# Patient Record
Sex: Female | Born: 1935
Health system: Southern US, Community
[De-identification: ages and names within clinical notes are randomized; demographics above are authoritative.]

## PROBLEM LIST (undated history)

## (undated) DIAGNOSIS — I10 Essential (primary) hypertension: Secondary | ICD-10-CM

---

## 2010-11-10 ENCOUNTER — Ambulatory Visit (HOSPITAL_COMMUNITY): Admission: AD | Admit: 2010-11-10 | Discharge: 2010-11-10 | Payer: Self-pay | Admitting: Internal Medicine

## 2012-10-10 ENCOUNTER — Ambulatory Visit (HOSPITAL_COMMUNITY)
Admission: RE | Admit: 2012-10-10 | Discharge: 2012-10-10 | Disposition: A | Discharge status: Home / Self Care | Payer: Medicare Other | Source: Ambulatory Visit | Attending: Internal Medicine | Admitting: Internal Medicine

## 2012-10-10 ENCOUNTER — Other Ambulatory Visit (HOSPITAL_COMMUNITY): Payer: Self-pay | Admitting: Internal Medicine

## 2012-10-10 DIAGNOSIS — G459 Transient cerebral ischemic attack, unspecified: Secondary | ICD-10-CM

## 2012-10-10 DIAGNOSIS — R5381 Other malaise: Secondary | ICD-10-CM | POA: Insufficient documentation

## 2012-10-10 DIAGNOSIS — R209 Unspecified disturbances of skin sensation: Secondary | ICD-10-CM | POA: Insufficient documentation

## 2014-05-20 ENCOUNTER — Encounter (INDEPENDENT_AMBULATORY_CARE_PROVIDER_SITE_OTHER): Payer: Self-pay | Admitting: *Deleted

## 2014-05-20 ENCOUNTER — Encounter (INDEPENDENT_AMBULATORY_CARE_PROVIDER_SITE_OTHER): Payer: Self-pay

## 2016-02-24 DIAGNOSIS — I10 Essential (primary) hypertension: Secondary | ICD-10-CM | POA: Diagnosis not present

## 2016-02-24 DIAGNOSIS — E782 Mixed hyperlipidemia: Secondary | ICD-10-CM | POA: Diagnosis not present

## 2016-02-27 DIAGNOSIS — E559 Vitamin D deficiency, unspecified: Secondary | ICD-10-CM | POA: Diagnosis not present

## 2016-02-27 DIAGNOSIS — R6889 Other general symptoms and signs: Secondary | ICD-10-CM | POA: Diagnosis not present

## 2016-02-27 DIAGNOSIS — R350 Frequency of micturition: Secondary | ICD-10-CM | POA: Diagnosis not present

## 2016-02-27 DIAGNOSIS — I10 Essential (primary) hypertension: Secondary | ICD-10-CM | POA: Diagnosis not present

## 2016-06-27 DIAGNOSIS — I10 Essential (primary) hypertension: Secondary | ICD-10-CM | POA: Diagnosis not present

## 2016-06-27 DIAGNOSIS — R600 Localized edema: Secondary | ICD-10-CM | POA: Diagnosis not present

## 2016-07-24 DIAGNOSIS — I10 Essential (primary) hypertension: Secondary | ICD-10-CM | POA: Diagnosis not present

## 2016-07-24 DIAGNOSIS — R600 Localized edema: Secondary | ICD-10-CM | POA: Diagnosis not present

## 2016-09-10 DIAGNOSIS — E782 Mixed hyperlipidemia: Secondary | ICD-10-CM | POA: Diagnosis not present

## 2016-09-10 DIAGNOSIS — I10 Essential (primary) hypertension: Secondary | ICD-10-CM | POA: Diagnosis not present

## 2016-09-10 DIAGNOSIS — E559 Vitamin D deficiency, unspecified: Secondary | ICD-10-CM | POA: Diagnosis not present

## 2016-09-12 DIAGNOSIS — Z0000 Encounter for general adult medical examination without abnormal findings: Secondary | ICD-10-CM | POA: Diagnosis not present

## 2016-09-12 DIAGNOSIS — Z23 Encounter for immunization: Secondary | ICD-10-CM | POA: Diagnosis not present

## 2016-09-12 DIAGNOSIS — K219 Gastro-esophageal reflux disease without esophagitis: Secondary | ICD-10-CM | POA: Diagnosis not present

## 2016-09-12 DIAGNOSIS — E559 Vitamin D deficiency, unspecified: Secondary | ICD-10-CM | POA: Diagnosis not present

## 2016-09-12 DIAGNOSIS — I10 Essential (primary) hypertension: Secondary | ICD-10-CM | POA: Diagnosis not present

## 2017-03-25 DIAGNOSIS — I10 Essential (primary) hypertension: Secondary | ICD-10-CM | POA: Diagnosis not present

## 2017-03-25 DIAGNOSIS — E782 Mixed hyperlipidemia: Secondary | ICD-10-CM | POA: Diagnosis not present

## 2017-03-25 DIAGNOSIS — E559 Vitamin D deficiency, unspecified: Secondary | ICD-10-CM | POA: Diagnosis not present

## 2017-03-27 DIAGNOSIS — K219 Gastro-esophageal reflux disease without esophagitis: Secondary | ICD-10-CM | POA: Diagnosis not present

## 2017-03-27 DIAGNOSIS — E559 Vitamin D deficiency, unspecified: Secondary | ICD-10-CM | POA: Diagnosis not present

## 2017-03-27 DIAGNOSIS — I10 Essential (primary) hypertension: Secondary | ICD-10-CM | POA: Diagnosis not present

## 2017-03-27 DIAGNOSIS — Z6820 Body mass index (BMI) 20.0-20.9, adult: Secondary | ICD-10-CM | POA: Diagnosis not present

## 2017-04-22 DIAGNOSIS — Z6820 Body mass index (BMI) 20.0-20.9, adult: Secondary | ICD-10-CM | POA: Diagnosis not present

## 2017-04-22 DIAGNOSIS — I10 Essential (primary) hypertension: Secondary | ICD-10-CM | POA: Diagnosis not present

## 2017-04-22 DIAGNOSIS — R69 Illness, unspecified: Secondary | ICD-10-CM | POA: Diagnosis not present

## 2017-05-14 DIAGNOSIS — I10 Essential (primary) hypertension: Secondary | ICD-10-CM | POA: Diagnosis not present

## 2017-05-14 DIAGNOSIS — R69 Illness, unspecified: Secondary | ICD-10-CM | POA: Diagnosis not present

## 2017-05-14 DIAGNOSIS — Z6820 Body mass index (BMI) 20.0-20.9, adult: Secondary | ICD-10-CM | POA: Diagnosis not present

## 2017-09-30 DIAGNOSIS — I10 Essential (primary) hypertension: Secondary | ICD-10-CM | POA: Diagnosis not present

## 2017-09-30 DIAGNOSIS — E559 Vitamin D deficiency, unspecified: Secondary | ICD-10-CM | POA: Diagnosis not present

## 2017-09-30 DIAGNOSIS — E782 Mixed hyperlipidemia: Secondary | ICD-10-CM | POA: Diagnosis not present

## 2017-10-02 DIAGNOSIS — E559 Vitamin D deficiency, unspecified: Secondary | ICD-10-CM | POA: Diagnosis not present

## 2017-10-02 DIAGNOSIS — K219 Gastro-esophageal reflux disease without esophagitis: Secondary | ICD-10-CM | POA: Diagnosis not present

## 2017-10-02 DIAGNOSIS — I10 Essential (primary) hypertension: Secondary | ICD-10-CM | POA: Diagnosis not present

## 2017-10-02 DIAGNOSIS — M79604 Pain in right leg: Secondary | ICD-10-CM | POA: Diagnosis not present

## 2017-10-02 DIAGNOSIS — Z23 Encounter for immunization: Secondary | ICD-10-CM | POA: Diagnosis not present

## 2017-11-18 ENCOUNTER — Other Ambulatory Visit (HOSPITAL_COMMUNITY): Payer: Self-pay | Admitting: Internal Medicine

## 2017-11-18 ENCOUNTER — Ambulatory Visit (HOSPITAL_COMMUNITY)
Admission: RE | Admit: 2017-11-18 | Discharge: 2017-11-18 | Disposition: A | Discharge status: Home / Self Care | Payer: Medicare HMO | Source: Ambulatory Visit | Attending: Internal Medicine | Admitting: Internal Medicine

## 2017-11-18 DIAGNOSIS — R1011 Right upper quadrant pain: Secondary | ICD-10-CM | POA: Diagnosis not present

## 2017-11-18 DIAGNOSIS — R109 Unspecified abdominal pain: Secondary | ICD-10-CM | POA: Insufficient documentation

## 2017-11-18 DIAGNOSIS — Z6820 Body mass index (BMI) 20.0-20.9, adult: Secondary | ICD-10-CM | POA: Diagnosis not present

## 2017-11-18 DIAGNOSIS — I10 Essential (primary) hypertension: Secondary | ICD-10-CM | POA: Diagnosis not present

## 2018-02-03 DIAGNOSIS — I10 Essential (primary) hypertension: Secondary | ICD-10-CM | POA: Diagnosis not present

## 2018-02-03 DIAGNOSIS — E782 Mixed hyperlipidemia: Secondary | ICD-10-CM | POA: Diagnosis not present

## 2018-02-03 DIAGNOSIS — K219 Gastro-esophageal reflux disease without esophagitis: Secondary | ICD-10-CM | POA: Diagnosis not present

## 2018-02-03 DIAGNOSIS — R600 Localized edema: Secondary | ICD-10-CM | POA: Diagnosis not present

## 2018-02-03 DIAGNOSIS — Z6820 Body mass index (BMI) 20.0-20.9, adult: Secondary | ICD-10-CM | POA: Diagnosis not present

## 2018-02-03 DIAGNOSIS — R69 Illness, unspecified: Secondary | ICD-10-CM | POA: Diagnosis not present

## 2018-02-03 DIAGNOSIS — R109 Unspecified abdominal pain: Secondary | ICD-10-CM | POA: Diagnosis not present

## 2018-02-03 DIAGNOSIS — E559 Vitamin D deficiency, unspecified: Secondary | ICD-10-CM | POA: Diagnosis not present

## 2018-02-03 DIAGNOSIS — M816 Localized osteoporosis [Lequesne]: Secondary | ICD-10-CM | POA: Diagnosis not present

## 2018-02-03 DIAGNOSIS — K589 Irritable bowel syndrome without diarrhea: Secondary | ICD-10-CM | POA: Diagnosis not present

## 2018-02-05 DIAGNOSIS — E559 Vitamin D deficiency, unspecified: Secondary | ICD-10-CM | POA: Diagnosis not present

## 2018-02-05 DIAGNOSIS — I10 Essential (primary) hypertension: Secondary | ICD-10-CM | POA: Diagnosis not present

## 2018-02-05 DIAGNOSIS — K219 Gastro-esophageal reflux disease without esophagitis: Secondary | ICD-10-CM | POA: Diagnosis not present

## 2018-02-19 DIAGNOSIS — R109 Unspecified abdominal pain: Secondary | ICD-10-CM | POA: Diagnosis not present

## 2018-02-19 DIAGNOSIS — Z6820 Body mass index (BMI) 20.0-20.9, adult: Secondary | ICD-10-CM | POA: Diagnosis not present

## 2018-02-19 DIAGNOSIS — E559 Vitamin D deficiency, unspecified: Secondary | ICD-10-CM | POA: Diagnosis not present

## 2018-02-19 DIAGNOSIS — E782 Mixed hyperlipidemia: Secondary | ICD-10-CM | POA: Diagnosis not present

## 2018-02-19 DIAGNOSIS — Z6821 Body mass index (BMI) 21.0-21.9, adult: Secondary | ICD-10-CM | POA: Diagnosis not present

## 2018-02-19 DIAGNOSIS — M816 Localized osteoporosis [Lequesne]: Secondary | ICD-10-CM | POA: Diagnosis not present

## 2018-02-19 DIAGNOSIS — R600 Localized edema: Secondary | ICD-10-CM | POA: Diagnosis not present

## 2018-02-19 DIAGNOSIS — K219 Gastro-esophageal reflux disease without esophagitis: Secondary | ICD-10-CM | POA: Diagnosis not present

## 2018-02-19 DIAGNOSIS — R69 Illness, unspecified: Secondary | ICD-10-CM | POA: Diagnosis not present

## 2018-02-19 DIAGNOSIS — I10 Essential (primary) hypertension: Secondary | ICD-10-CM | POA: Diagnosis not present

## 2018-03-19 DIAGNOSIS — R600 Localized edema: Secondary | ICD-10-CM | POA: Diagnosis not present

## 2018-03-19 DIAGNOSIS — M5431 Sciatica, right side: Secondary | ICD-10-CM | POA: Diagnosis not present

## 2018-03-19 DIAGNOSIS — M816 Localized osteoporosis [Lequesne]: Secondary | ICD-10-CM | POA: Diagnosis not present

## 2018-03-19 DIAGNOSIS — K219 Gastro-esophageal reflux disease without esophagitis: Secondary | ICD-10-CM | POA: Diagnosis not present

## 2018-03-19 DIAGNOSIS — Z6821 Body mass index (BMI) 21.0-21.9, adult: Secondary | ICD-10-CM | POA: Diagnosis not present

## 2018-03-19 DIAGNOSIS — E559 Vitamin D deficiency, unspecified: Secondary | ICD-10-CM | POA: Diagnosis not present

## 2018-03-19 DIAGNOSIS — Z6820 Body mass index (BMI) 20.0-20.9, adult: Secondary | ICD-10-CM | POA: Diagnosis not present

## 2018-03-19 DIAGNOSIS — R69 Illness, unspecified: Secondary | ICD-10-CM | POA: Diagnosis not present

## 2018-03-19 DIAGNOSIS — R109 Unspecified abdominal pain: Secondary | ICD-10-CM | POA: Diagnosis not present

## 2018-03-19 DIAGNOSIS — I10 Essential (primary) hypertension: Secondary | ICD-10-CM | POA: Diagnosis not present

## 2018-06-20 DIAGNOSIS — Z6820 Body mass index (BMI) 20.0-20.9, adult: Secondary | ICD-10-CM | POA: Diagnosis not present

## 2018-06-20 DIAGNOSIS — R69 Illness, unspecified: Secondary | ICD-10-CM | POA: Diagnosis not present

## 2018-06-20 DIAGNOSIS — E559 Vitamin D deficiency, unspecified: Secondary | ICD-10-CM | POA: Diagnosis not present

## 2018-06-20 DIAGNOSIS — M816 Localized osteoporosis [Lequesne]: Secondary | ICD-10-CM | POA: Diagnosis not present

## 2018-06-20 DIAGNOSIS — I10 Essential (primary) hypertension: Secondary | ICD-10-CM | POA: Diagnosis not present

## 2018-06-20 DIAGNOSIS — R109 Unspecified abdominal pain: Secondary | ICD-10-CM | POA: Diagnosis not present

## 2018-06-20 DIAGNOSIS — Z6821 Body mass index (BMI) 21.0-21.9, adult: Secondary | ICD-10-CM | POA: Diagnosis not present

## 2018-06-20 DIAGNOSIS — R600 Localized edema: Secondary | ICD-10-CM | POA: Diagnosis not present

## 2018-06-20 DIAGNOSIS — M5431 Sciatica, right side: Secondary | ICD-10-CM | POA: Diagnosis not present

## 2018-06-20 DIAGNOSIS — K219 Gastro-esophageal reflux disease without esophagitis: Secondary | ICD-10-CM | POA: Diagnosis not present

## 2018-06-25 DIAGNOSIS — E559 Vitamin D deficiency, unspecified: Secondary | ICD-10-CM | POA: Diagnosis not present

## 2018-06-25 DIAGNOSIS — E782 Mixed hyperlipidemia: Secondary | ICD-10-CM | POA: Diagnosis not present

## 2018-06-25 DIAGNOSIS — I10 Essential (primary) hypertension: Secondary | ICD-10-CM | POA: Diagnosis not present

## 2018-06-25 DIAGNOSIS — K219 Gastro-esophageal reflux disease without esophagitis: Secondary | ICD-10-CM | POA: Diagnosis not present

## 2018-06-25 DIAGNOSIS — Z6820 Body mass index (BMI) 20.0-20.9, adult: Secondary | ICD-10-CM | POA: Diagnosis not present

## 2018-09-29 DIAGNOSIS — R69 Illness, unspecified: Secondary | ICD-10-CM | POA: Diagnosis not present

## 2018-11-18 DIAGNOSIS — M5431 Sciatica, right side: Secondary | ICD-10-CM | POA: Diagnosis not present

## 2018-11-18 DIAGNOSIS — I10 Essential (primary) hypertension: Secondary | ICD-10-CM | POA: Diagnosis not present

## 2018-11-18 DIAGNOSIS — E559 Vitamin D deficiency, unspecified: Secondary | ICD-10-CM | POA: Diagnosis not present

## 2018-11-18 DIAGNOSIS — R109 Unspecified abdominal pain: Secondary | ICD-10-CM | POA: Diagnosis not present

## 2018-11-18 DIAGNOSIS — R600 Localized edema: Secondary | ICD-10-CM | POA: Diagnosis not present

## 2018-11-18 DIAGNOSIS — Z6821 Body mass index (BMI) 21.0-21.9, adult: Secondary | ICD-10-CM | POA: Diagnosis not present

## 2018-11-18 DIAGNOSIS — M816 Localized osteoporosis [Lequesne]: Secondary | ICD-10-CM | POA: Diagnosis not present

## 2018-11-18 DIAGNOSIS — Z6820 Body mass index (BMI) 20.0-20.9, adult: Secondary | ICD-10-CM | POA: Diagnosis not present

## 2018-11-18 DIAGNOSIS — R69 Illness, unspecified: Secondary | ICD-10-CM | POA: Diagnosis not present

## 2018-11-18 DIAGNOSIS — K219 Gastro-esophageal reflux disease without esophagitis: Secondary | ICD-10-CM | POA: Diagnosis not present

## 2018-11-18 DIAGNOSIS — Z0000 Encounter for general adult medical examination without abnormal findings: Secondary | ICD-10-CM | POA: Diagnosis not present

## 2018-11-25 DIAGNOSIS — R7301 Impaired fasting glucose: Secondary | ICD-10-CM | POA: Diagnosis not present

## 2018-11-25 DIAGNOSIS — K219 Gastro-esophageal reflux disease without esophagitis: Secondary | ICD-10-CM | POA: Diagnosis not present

## 2018-11-25 DIAGNOSIS — I10 Essential (primary) hypertension: Secondary | ICD-10-CM | POA: Diagnosis not present

## 2018-11-25 DIAGNOSIS — E559 Vitamin D deficiency, unspecified: Secondary | ICD-10-CM | POA: Diagnosis not present

## 2018-11-25 DIAGNOSIS — R69 Illness, unspecified: Secondary | ICD-10-CM | POA: Diagnosis not present

## 2018-12-05 ENCOUNTER — Emergency Department (HOSPITAL_COMMUNITY)
Admission: EM | Admit: 2018-12-05 | Discharge: 2018-12-05 | Disposition: A | Discharge status: Home / Self Care | Payer: Medicare HMO | Attending: Emergency Medicine | Admitting: Emergency Medicine

## 2018-12-05 ENCOUNTER — Emergency Department (HOSPITAL_COMMUNITY): Payer: Medicare HMO

## 2018-12-05 ENCOUNTER — Encounter (HOSPITAL_COMMUNITY): Payer: Self-pay | Admitting: Emergency Medicine

## 2018-12-05 ENCOUNTER — Other Ambulatory Visit: Payer: Self-pay

## 2018-12-05 DIAGNOSIS — I10 Essential (primary) hypertension: Secondary | ICD-10-CM | POA: Insufficient documentation

## 2018-12-05 DIAGNOSIS — R079 Chest pain, unspecified: Secondary | ICD-10-CM | POA: Diagnosis not present

## 2018-12-05 DIAGNOSIS — R1011 Right upper quadrant pain: Secondary | ICD-10-CM | POA: Diagnosis not present

## 2018-12-05 HISTORY — DX: Essential (primary) hypertension: I10

## 2018-12-05 LAB — CBC
HEMATOCRIT: 39 % (ref 36.0–46.0)
Hemoglobin: 13 g/dL (ref 12.0–15.0)
MCH: 30.7 pg (ref 26.0–34.0)
MCHC: 33.3 g/dL (ref 30.0–36.0)
MCV: 92.2 fL (ref 80.0–100.0)
NRBC: 0 % (ref 0.0–0.2)
Platelets: 205 10*3/uL (ref 150–400)
RBC: 4.23 MIL/uL (ref 3.87–5.11)
RDW: 11.9 % (ref 11.5–15.5)
WBC: 7.4 10*3/uL (ref 4.0–10.5)

## 2018-12-05 LAB — BASIC METABOLIC PANEL
ANION GAP: 7 (ref 5–15)
BUN: 9 mg/dL (ref 8–23)
CHLORIDE: 98 mmol/L (ref 98–111)
CO2: 28 mmol/L (ref 22–32)
Calcium: 9.1 mg/dL (ref 8.9–10.3)
Creatinine, Ser: 0.67 mg/dL (ref 0.44–1.00)
GFR calc Af Amer: >60 mL/min (ref >60)
GFR calc non Af Amer: >60 mL/min (ref >60)
Glucose, Bld: 103 mg/dL — ABNORMAL HIGH (ref 70–99)
Potassium: 3.4 mmol/L — ABNORMAL LOW (ref 3.5–5.1)
Sodium: 133 mmol/L — ABNORMAL LOW (ref 135–145)

## 2018-12-05 LAB — TROPONIN I: Troponin I: <0.03 ng/mL (ref <0.03)

## 2018-12-05 LAB — LIPASE, BLOOD: Lipase: 28 U/L (ref 11–51)

## 2018-12-05 LAB — HEPATIC FUNCTION PANEL
ALBUMIN: 4.3 g/dL (ref 3.5–5.0)
ALK PHOS: 65 U/L (ref 38–126)
ALT: 17 U/L (ref 0–44)
AST: 25 U/L (ref 15–41)
BILIRUBIN TOTAL: 1.3 mg/dL — AB (ref 0.3–1.2)
Bilirubin, Direct: 0.2 mg/dL (ref 0.0–0.2)
Indirect Bilirubin: 1.1 mg/dL — ABNORMAL HIGH (ref 0.3–0.9)
Total Protein: 7.2 g/dL (ref 6.5–8.1)

## 2018-12-05 MED ORDER — ACETAMINOPHEN 325 MG PO TABS
ORAL_TABLET | ORAL | Status: AC
  Filled 2018-12-05: qty 2

## 2018-12-05 MED ORDER — NITROGLYCERIN 0.4 MG SL SUBL
0.4000 mg | SUBLINGUAL_TABLET | Freq: Once | SUBLINGUAL | Status: AC
  Administered 2018-12-05: 0.4 mg via SUBLINGUAL
  Filled 2018-12-05: qty 1

## 2018-12-05 NOTE — ED Notes (Signed)
Called ultrasound to check pt status. Patient was in the restroom when ultrasound tech came to get pt. Ultrasound stated they would get pt shortly.

## 2018-12-05 NOTE — Discharge Instructions (Signed)
Tests showed no life-threatening condition.  You are not anemic.  Your glucose is normal.  EKG and chest x-ray normal.  Ultrasound normal.  Follow-up your primary care doctor.

## 2018-12-05 NOTE — ED Provider Notes (Addendum)
[facility] Provider Note   CSN: [id number] Arrival date & time: 12/05/18  1139     History   Chief Complaint Chief Complaint  Patient presents with  . Chest Pain    HPI Mindy Jordan is a 84 y.o. female.  Left-sided chest pain, right upper quadrant pain radiating to the back for 2 days.  Chest pain is described as a pressure.  No dyspnea, diaphoresis, nausea.  No history of coronary artery disease.  She has had a stroke.  Patient has been on Protonix at one time and this was discontinu  Past medical history hypertension.  Severity of symptoms is mild to moderate.  Nothing makes symptoms better or worse.     Past Medical History:  Diagnosis Date  . Hypertension     There are no active problems to display for this patient.   History reviewed. No pertinent surgical history.   OB History   No obstetric history on file.      Home Medications    Prior to Admission medications   Not on File    Family History No family history on file.  Social History Social History   Tobacco Use  . Smoking status: Never Smoker  . Smokeless tobacco: Never Used  Substance Use Topics  . Alcohol use: Not Currently  . Drug use: Never     Allergies   Codeine   Review of Systems Review of Systems  All other systems reviewed and are negative.    Physical Exam Updated Vital Signs BP (!) 143/67   Pulse 75   Temp 98.3 F (36.8 C) (Oral)   Resp (!) 25   Ht 5\' 2"  (1.575 m)   Wt 51.7 kg   SpO2 100%   BMI 20.85 kg/m   Physical Exam Vitals signs and nursing note reviewed.  Constitutional:      Appearance: She is well-developed.  HENT:     Head: Normocephalic and atraumatic.  Eyes:     Conjunctiva/sclera: Conjunctivae normal.  Neck:     Musculoskeletal: Neck supple.  Cardiovascular:     Rate and Rhythm: Normal rate and regular rhythm.  Pulmonary:     Effort: Pulmonary effort is normal.     Breath sounds: Normal breath sounds.    Abdominal:     General: Bowel sounds are normal.     Palpations: Abdomen is soft.     Comments: Tender right upper quadrant  Musculoskeletal: Normal range of motion.  Skin:    General: Skin is warm and dry.  Neurological:     Mental Status: She is alert and oriented to person, place, and time.  Psychiatric:        Behavior: Behavior normal.      ED Treatments / Results  Labs (all labs ordered are listed, but only abnormal results are displayed) Labs Reviewed  BASIC METABOLIC PANEL - Abnormal; Notable for the following components:      Result Value   Sodium 133 (*)    Potassium 3.4 (*)    Glucose, Bld 103 (*)    All other components within normal limits  HEPATIC FUNCTION PANEL - Abnormal; Notable for the following components:   Total Bilirubin 1.3 (*)    Indirect Bilirubin 1.1 (*)    All other components within normal limits  CBC  TROPONIN I  LIPASE, BLOOD    EKG EKG Interpretation  Date/Time:  Friday December 05 2018 11:52:11 EST Ventricular Rate:  67 PR Interval:    QRS  Duration: 127 QT Interval:  428 QTC Calculation: 452 R Axis:   -15 Text Interpretation:  Sinus rhythm Probable left atrial enlargement Right bundle branch block Confirmed by Donnetta Hutchingook, Cypress Hinkson ([id number]) on 12/05/2018 1:14:27 PM   Radiology Dg Chest 2 View  Result Date: 12/05/2018 CLINICAL DATA:  Chest pain for 2 days EXAM: CHEST - 2 VIEW COMPARISON:  None. FINDINGS: Cardiac shadow is within normal limits. Aortic calcifications are again seen. The lungs are well aerated bilaterally. No focal infiltrate or sizable effusion are seen. No acute bony abnormality is noted. IMPRESSION: No active cardiopulmonary disease. Electronically Signed   By: Alcide CleverMark  Lukens M.D.   On: 12/05/2018 13:19    Procedures Procedures (including critical care time)  Medications Ordered in ED Medications  nitroGLYCERIN (NITROSTAT) SL tablet 0.4 mg (0.4 mg Sublingual Given 12/05/18 1312)     Initial Impression / Assessment and  Plan / ED Course  I have reviewed the triage vital signs and the nursing notes.  Pertinent labs & imaging results that were available during my care of the patient were reviewed by me and considered in my medical decision making (see chart for details).     Patient presents with left-sided chest pain and right upper quadrant pain.  Patient is in no acute distress.  Cardiac work-up negative.  Will obtain right upper quadrant ultrasound.  Discussed with Dr. Deretha EmoryZackowski.  Final Clinical Impressions(s) / ED Diagnoses   Final diagnoses:  RUQ abdominal pain  Chest pain, unspecified type    ED Discharge Orders    None       Donnetta Hutchingook, Janette Harvie, MD 12/05/18 1502    Donnetta Hutchingook, Sheridan Hew, MD 12/05/18 1524

## 2018-12-05 NOTE — ED Provider Notes (Signed)
Ultrasound of gallbladder is negative.   Vanetta MuldersZackowski, Bita Cartwright, MD 12/05/18 [phone]

## 2018-12-05 NOTE — ED Triage Notes (Signed)
Reports chest pain for 2 days  Points to RUQ

## 2019-04-02 DIAGNOSIS — I10 Essential (primary) hypertension: Secondary | ICD-10-CM | POA: Diagnosis not present

## 2019-04-02 DIAGNOSIS — R69 Illness, unspecified: Secondary | ICD-10-CM | POA: Diagnosis not present

## 2019-04-02 DIAGNOSIS — E559 Vitamin D deficiency, unspecified: Secondary | ICD-10-CM | POA: Diagnosis not present

## 2019-04-02 DIAGNOSIS — K219 Gastro-esophageal reflux disease without esophagitis: Secondary | ICD-10-CM | POA: Diagnosis not present

## 2019-04-29 DIAGNOSIS — Z0000 Encounter for general adult medical examination without abnormal findings: Secondary | ICD-10-CM | POA: Diagnosis not present

## 2019-07-12 IMAGING — US US ABDOMEN COMPLETE
1 series · 14 of 25 positions shown · non-contrast
Comparison: None.

CLINICAL DATA: Right upper quadrant abdominal pain beginning 3 days
ago.

EXAM:
ABDOMEN ULTRASOUND COMPLETE

[Series 1: us abdomen complete · 0.10mm/px · 14 of 92 slices shown]
[im 1/92]
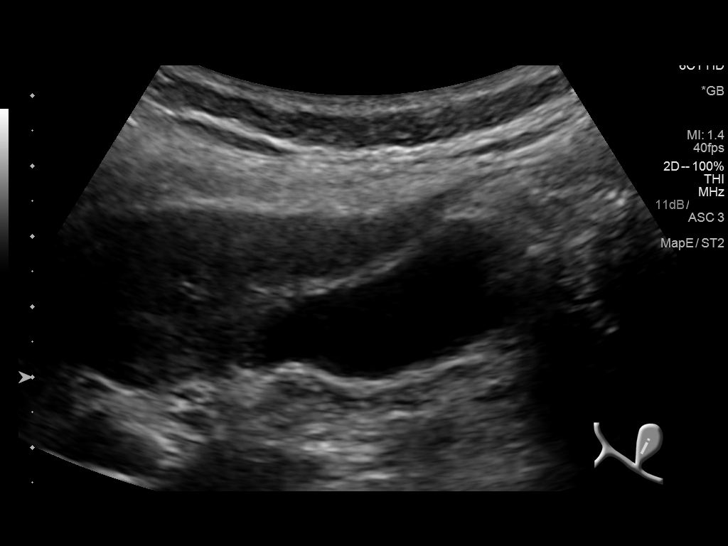
[im 8/92]
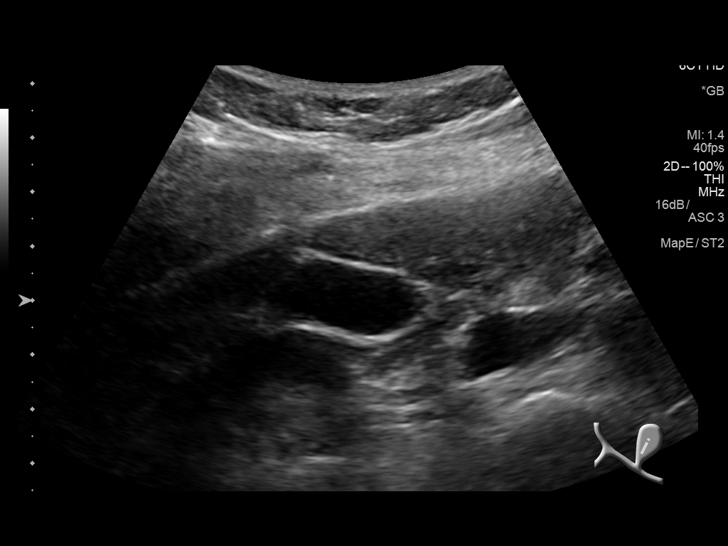
[im 16/92]
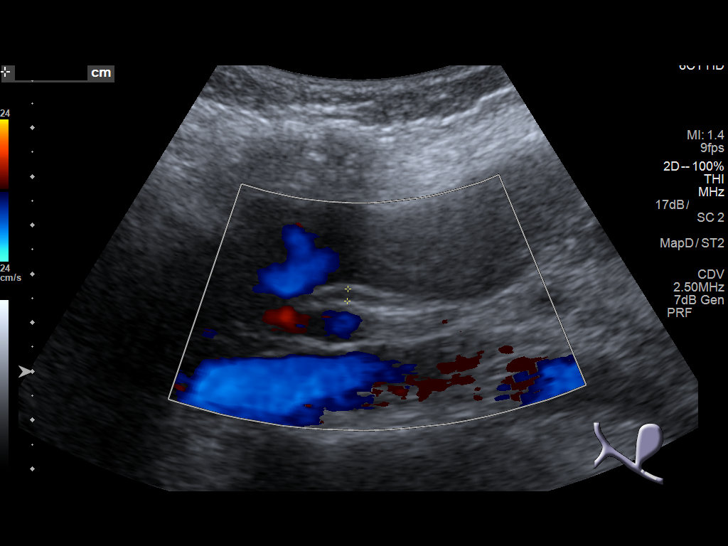
[im 23/92]
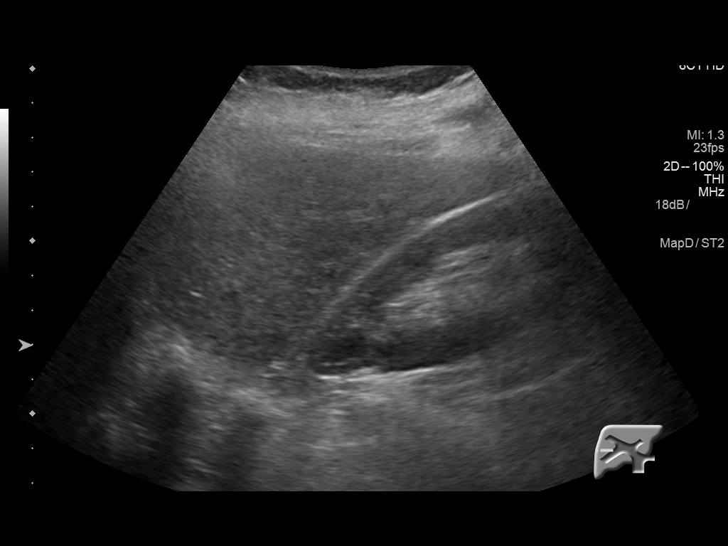
[im 31/92]
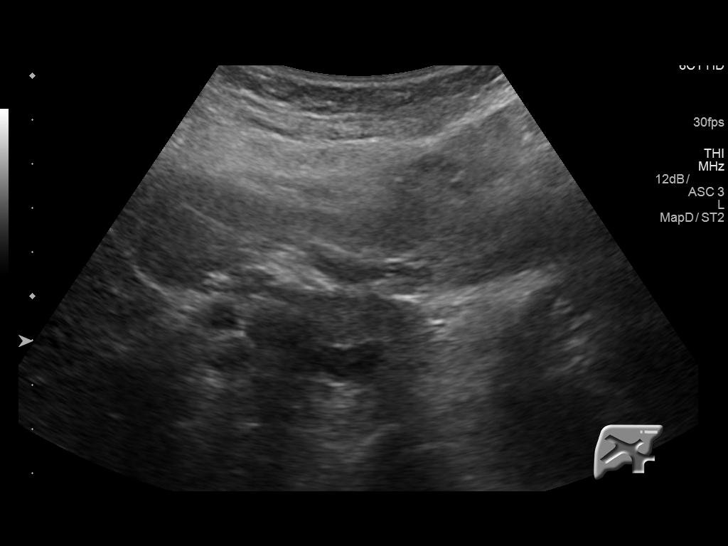
[im 35/92]
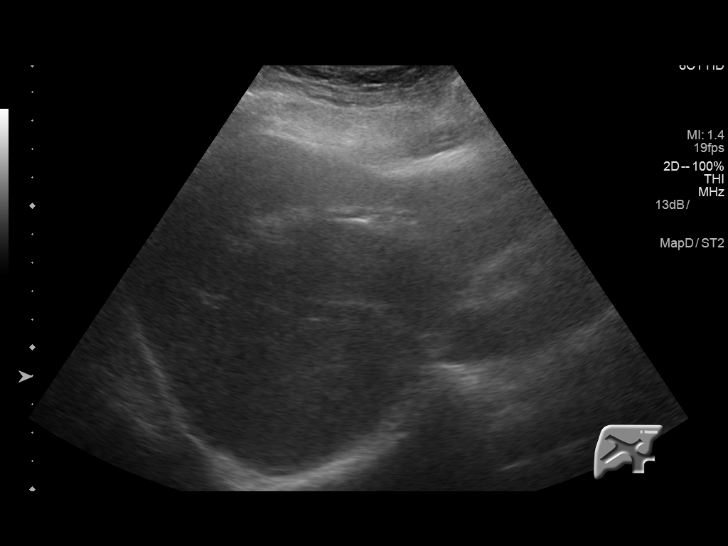
[im 42/92]
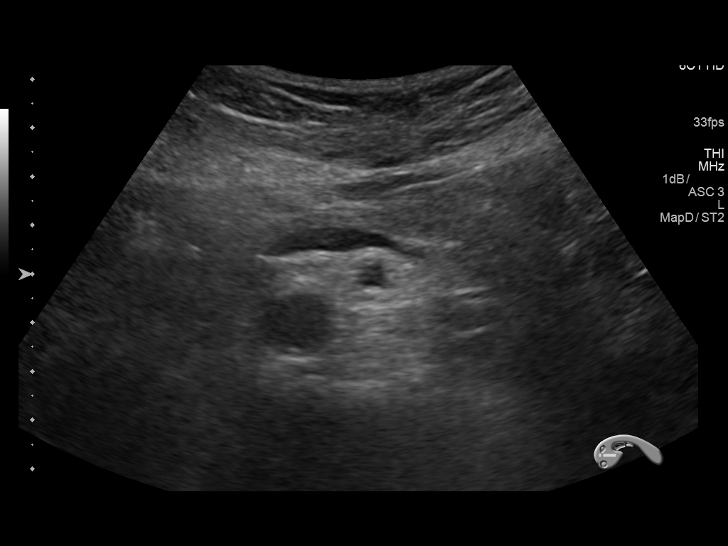
[im 50/92]
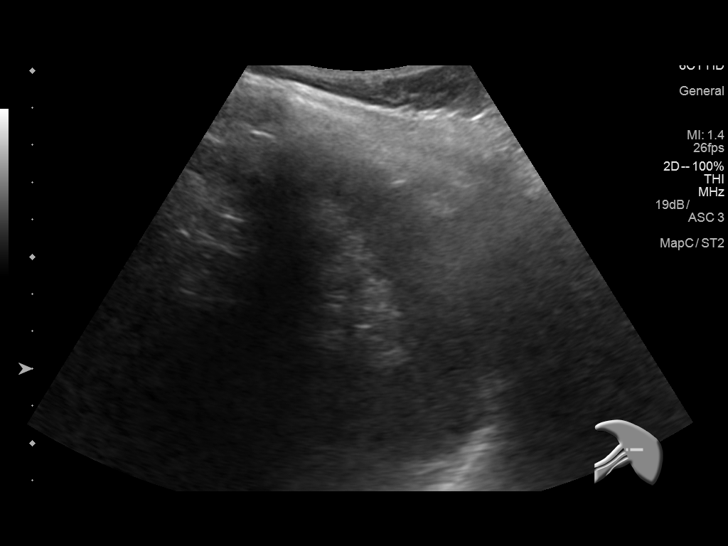
[im 57/92]
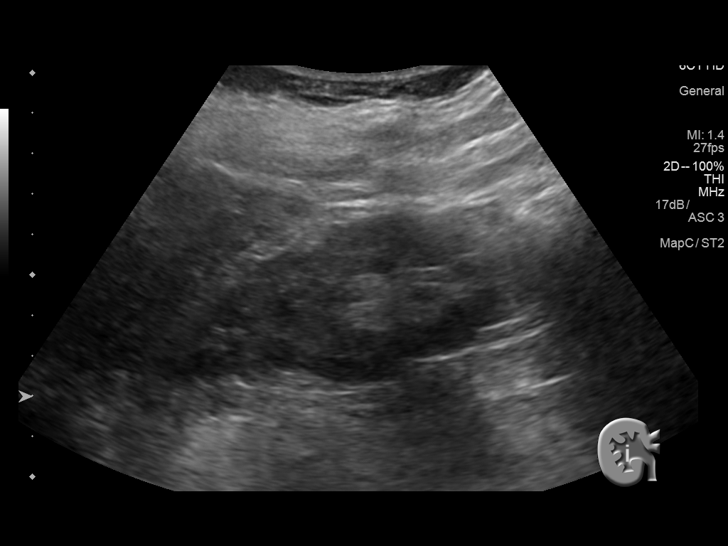
[im 61/92]
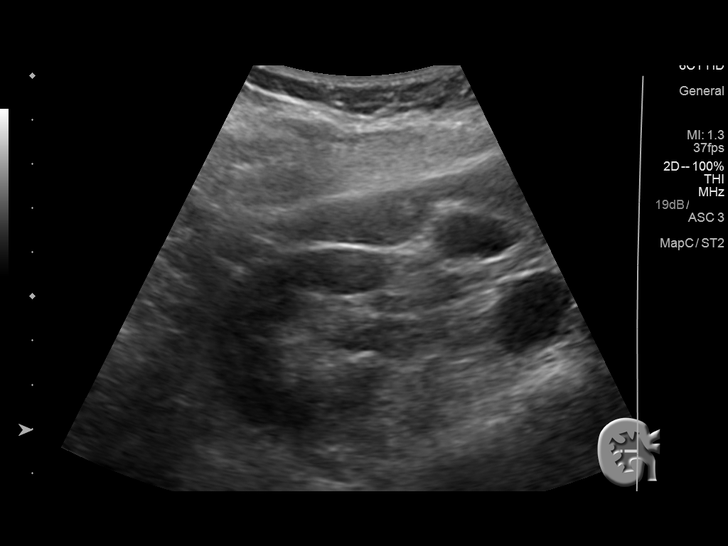
[im 69/92]
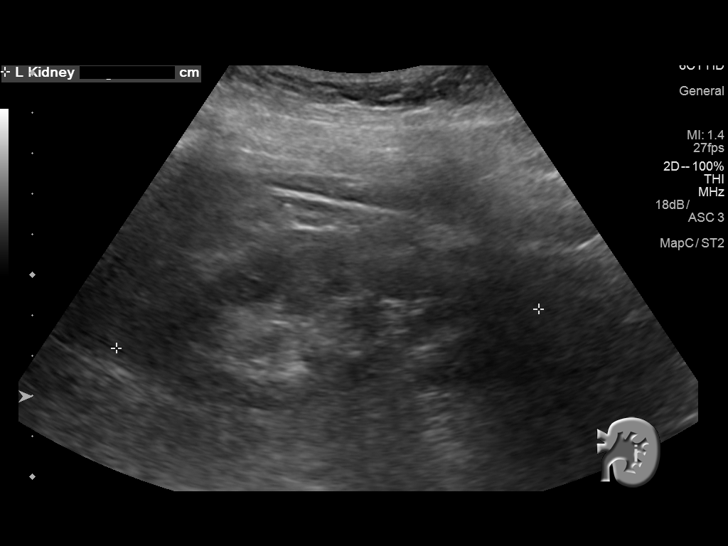
[im 76/92]
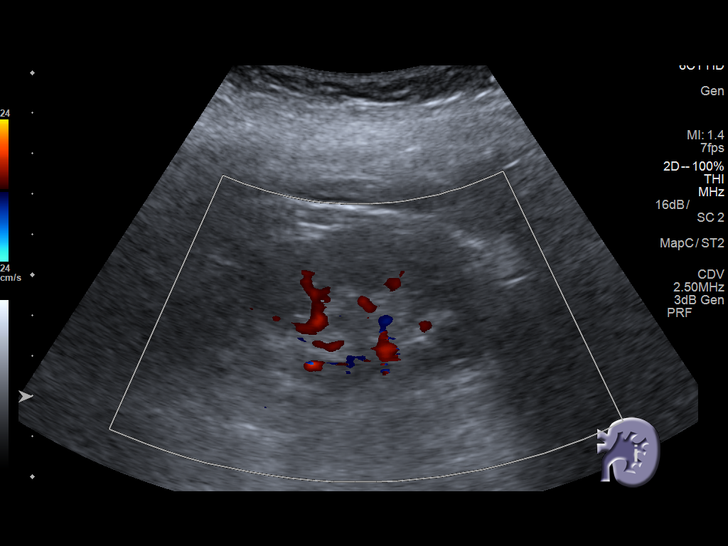
[im 84/92]
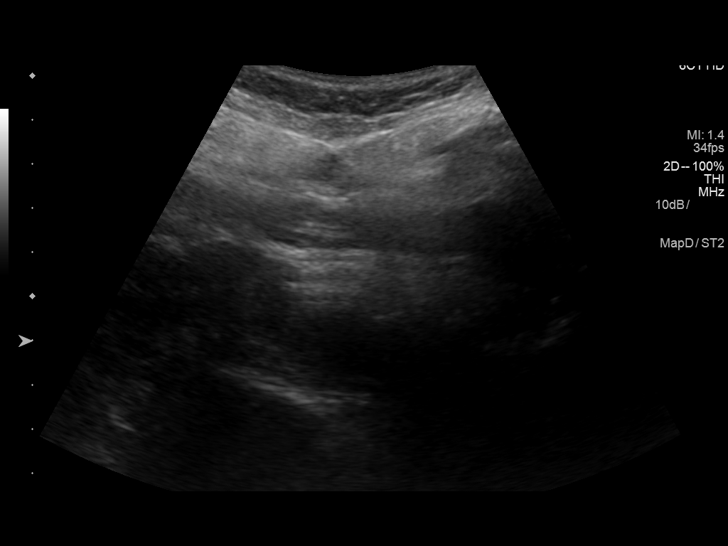
[im 92/92]
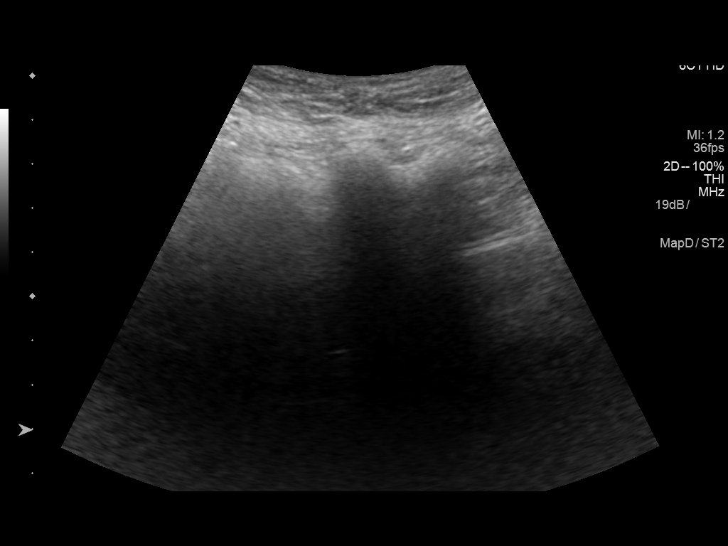

[14 of 25 positions shown; findings below may reference images not displayed]

FINDINGS: Gallbladder: No gallstones or wall thickening visualized. No
sonographic Murphy sign noted by sonographer.

Common bile duct: Diameter: 2.5 mm common normal

Liver: No focal lesion identified. Within normal limits in
parenchymal echogenicity. Portal vein is patent on color Doppler
imaging with normal direction of blood flow towards the liver.

IVC: No abnormality visualized.

Pancreas: Visualized portion unremarkable.

Spleen: Size and appearance within normal limits.

Right Kidney: Length: 9.9 cm. Echogenicity within normal limits. No
mass or hydronephrosis visualized.

Left Kidney: Length: 10.5 cm. Echogenicity within normal limits. No
mass or hydronephrosis visualized.

Abdominal aorta: No aneurysm visualized.

Other findings: No ascites
IMPRESSION: Normal abdominal ultrasound. No abnormality seen to explain right
upper quadrant pain.

## 2019-08-10 DIAGNOSIS — E782 Mixed hyperlipidemia: Secondary | ICD-10-CM | POA: Diagnosis not present

## 2019-08-10 DIAGNOSIS — M816 Localized osteoporosis [Lequesne]: Secondary | ICD-10-CM | POA: Diagnosis not present

## 2019-08-10 DIAGNOSIS — E559 Vitamin D deficiency, unspecified: Secondary | ICD-10-CM | POA: Diagnosis not present

## 2019-08-10 DIAGNOSIS — I10 Essential (primary) hypertension: Secondary | ICD-10-CM | POA: Diagnosis not present

## 2019-08-10 DIAGNOSIS — R7301 Impaired fasting glucose: Secondary | ICD-10-CM | POA: Diagnosis not present

## 2019-08-12 DIAGNOSIS — K219 Gastro-esophageal reflux disease without esophagitis: Secondary | ICD-10-CM | POA: Diagnosis not present

## 2019-08-12 DIAGNOSIS — R300 Dysuria: Secondary | ICD-10-CM | POA: Diagnosis not present

## 2019-08-12 DIAGNOSIS — R109 Unspecified abdominal pain: Secondary | ICD-10-CM | POA: Diagnosis not present

## 2019-08-12 DIAGNOSIS — N390 Urinary tract infection, site not specified: Secondary | ICD-10-CM | POA: Diagnosis not present

## 2019-08-12 DIAGNOSIS — R7301 Impaired fasting glucose: Secondary | ICD-10-CM | POA: Diagnosis not present

## 2019-08-12 DIAGNOSIS — E559 Vitamin D deficiency, unspecified: Secondary | ICD-10-CM | POA: Diagnosis not present

## 2019-08-12 DIAGNOSIS — I10 Essential (primary) hypertension: Secondary | ICD-10-CM | POA: Diagnosis not present

## 2019-08-12 DIAGNOSIS — R69 Illness, unspecified: Secondary | ICD-10-CM | POA: Diagnosis not present

## 2019-09-08 DIAGNOSIS — R69 Illness, unspecified: Secondary | ICD-10-CM | POA: Diagnosis not present

## 2019-11-29 IMAGING — US US ABDOMEN LIMITED
1 series · 14 of 25 positions shown · non-contrast
Comparison: Abdominal ultrasound 11/18/2017

CLINICAL DATA: Right upper quadrant abdominal pain.

EXAM:
ULTRASOUND ABDOMEN LIMITED RIGHT UPPER QUADRANT

[Series 1: us abdomen limited · 14 of 60 slices shown]
[im 1/60]
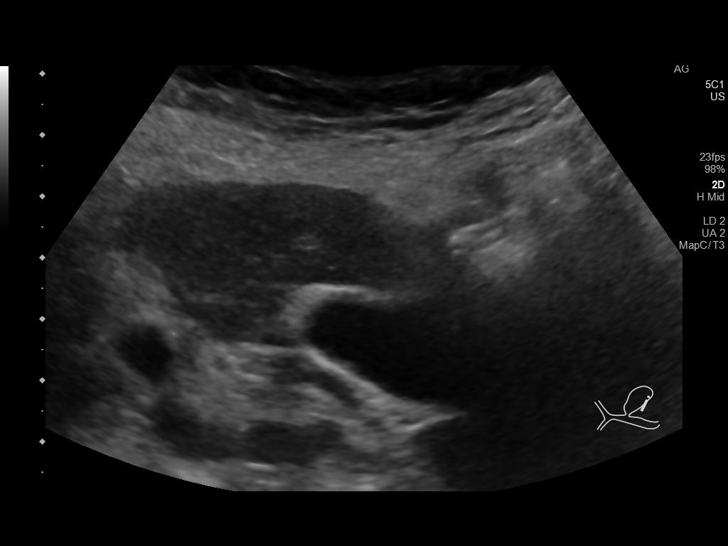
[im 5/60]
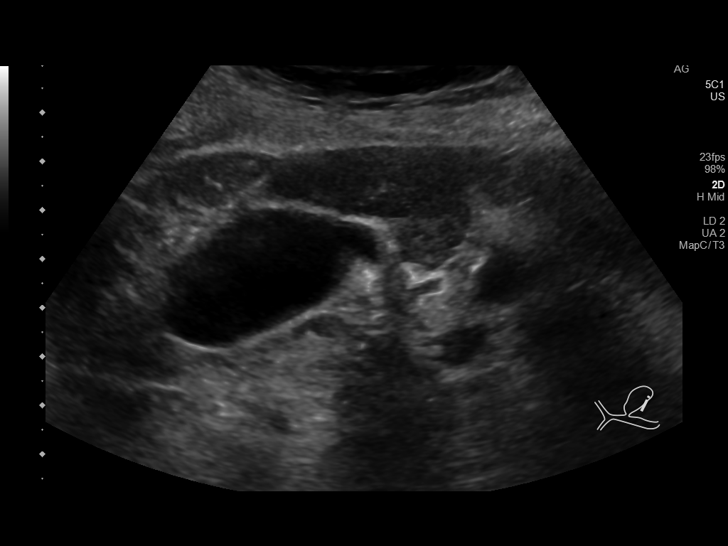
[im 10/60]
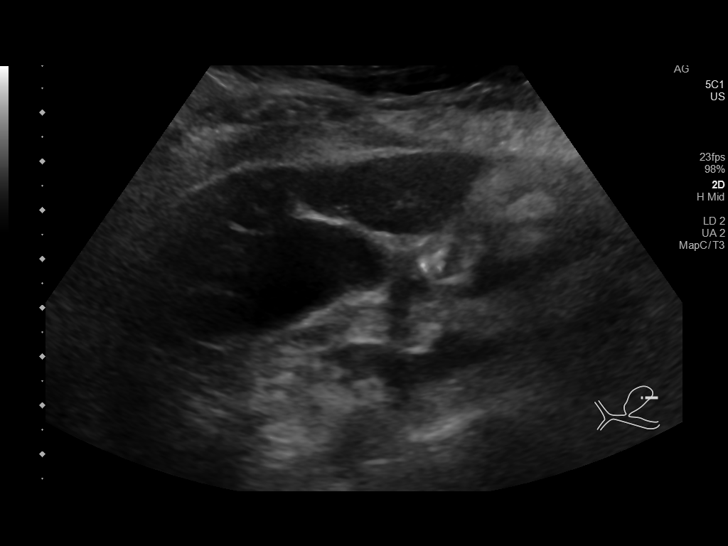
[im 15/60]
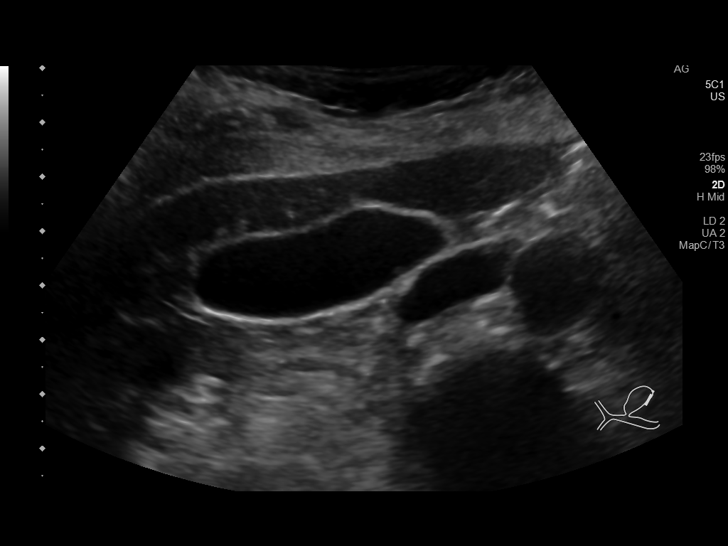
[im 20/60]
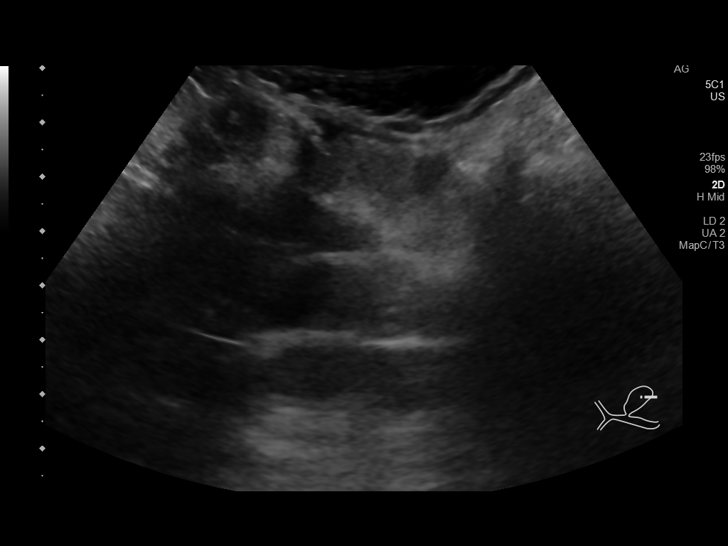
[im 23/60]
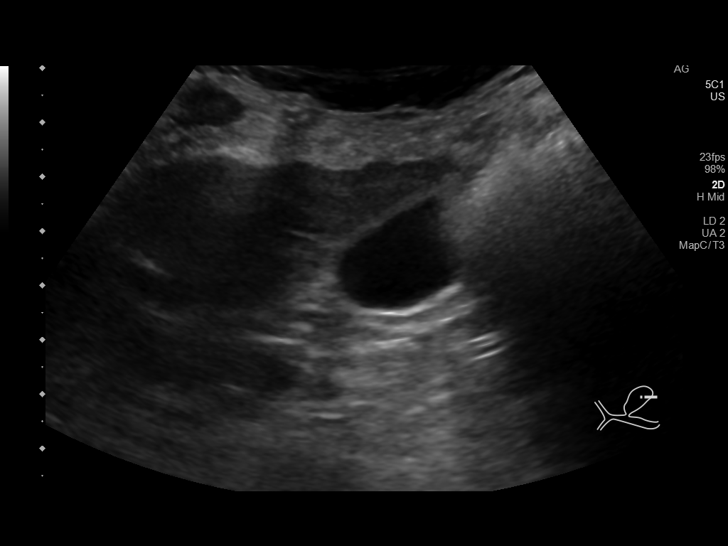
[im 28/60]
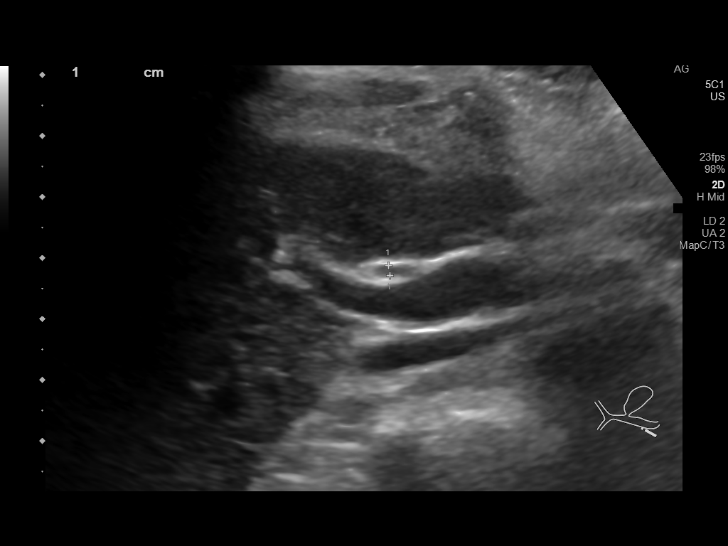
[im 32/60]
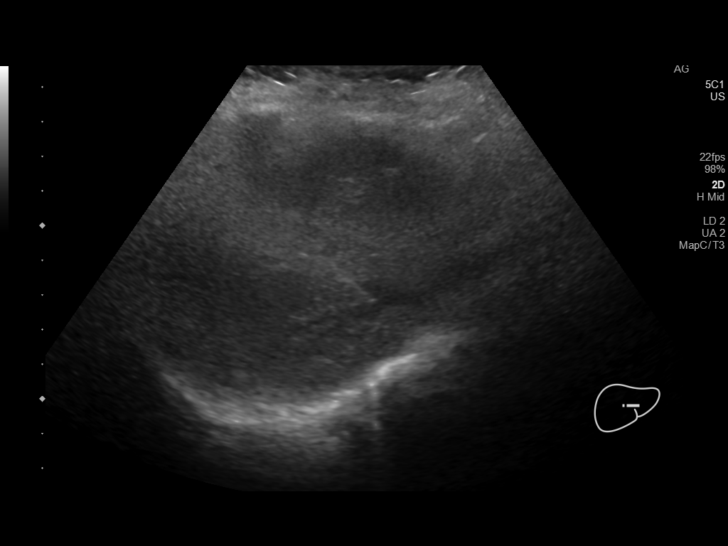
[im 37/60]
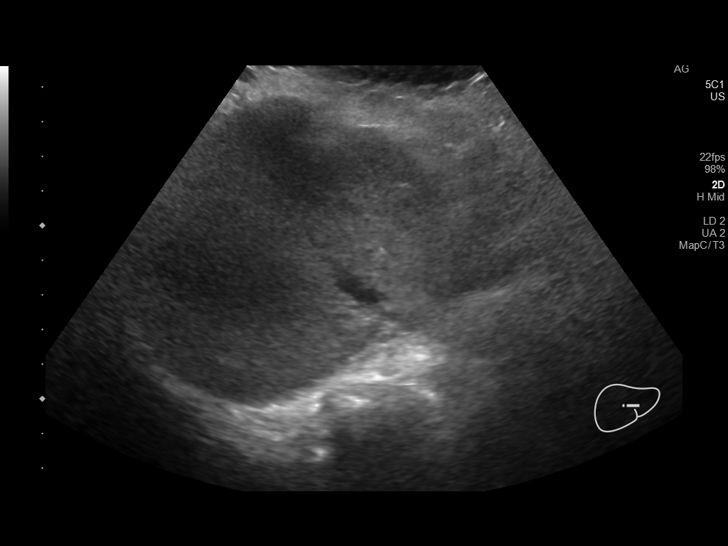
[im 40/60]
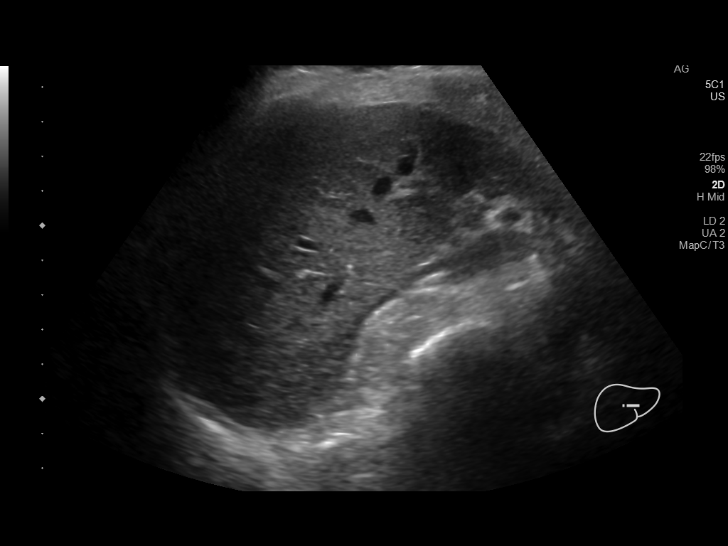
[im 45/60]
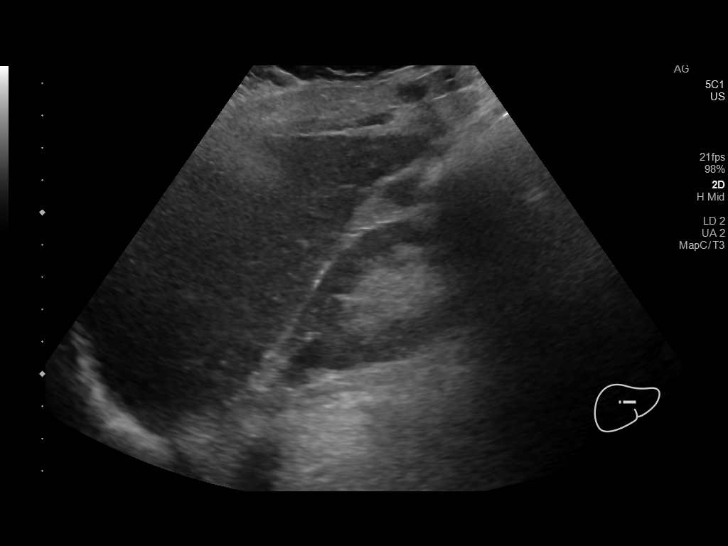
[im 50/60]
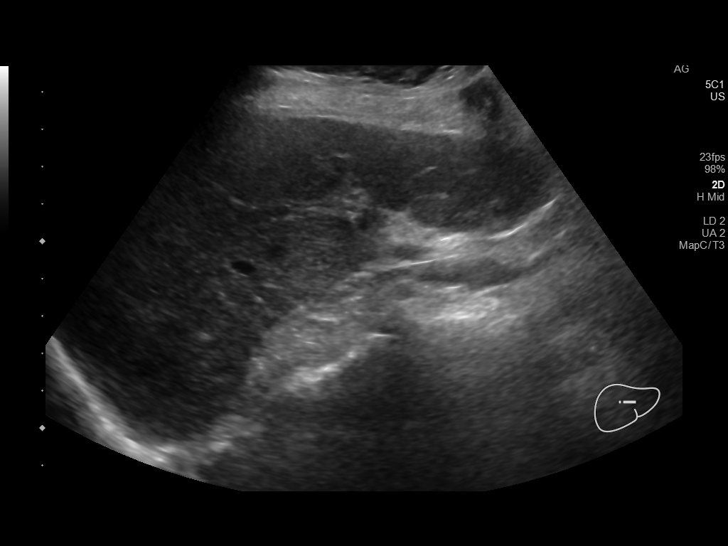
[im 55/60]
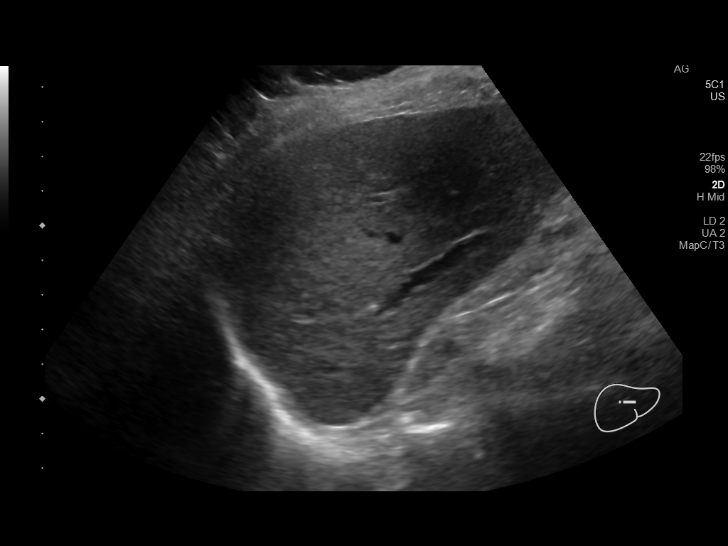
[im 60/60]
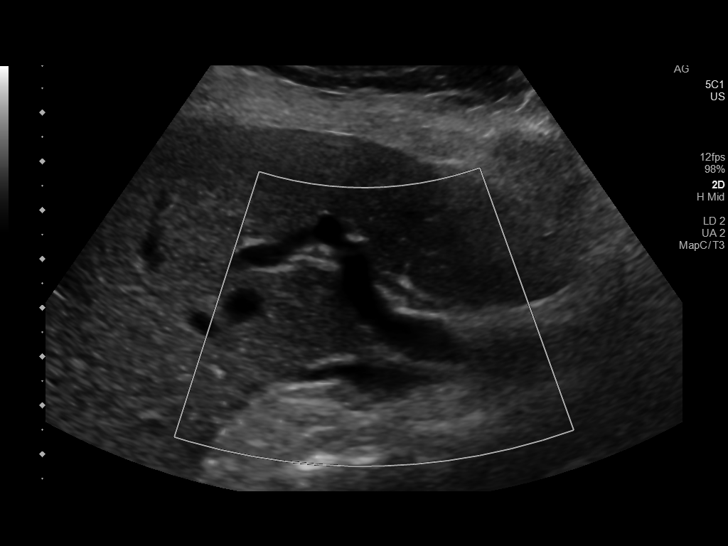

[14 of 25 positions shown; findings below may reference images not displayed]

FINDINGS: Gallbladder:

No gallstones or wall thickening visualized. No sonographic Murphy
sign noted by sonographer.

Common bile duct:

Diameter: 1.6 mm, within normal limits

Liver:

Mild increased echogenicity is present with some attenuation of
internal architecture. No discrete lesions are present. Portal vein
is patent on color Doppler imaging with normal direction of blood
flow towards the liver.
IMPRESSION: Negative right upper quadrant ultrasound.

## 2019-11-29 IMAGING — DX DG CHEST 2V
2 series · 2 of 2 positions shown · non-contrast
Comparison: None.

CLINICAL DATA: Chest pain for 2 days

EXAM:
CHEST - 2 VIEW

[chest pa]
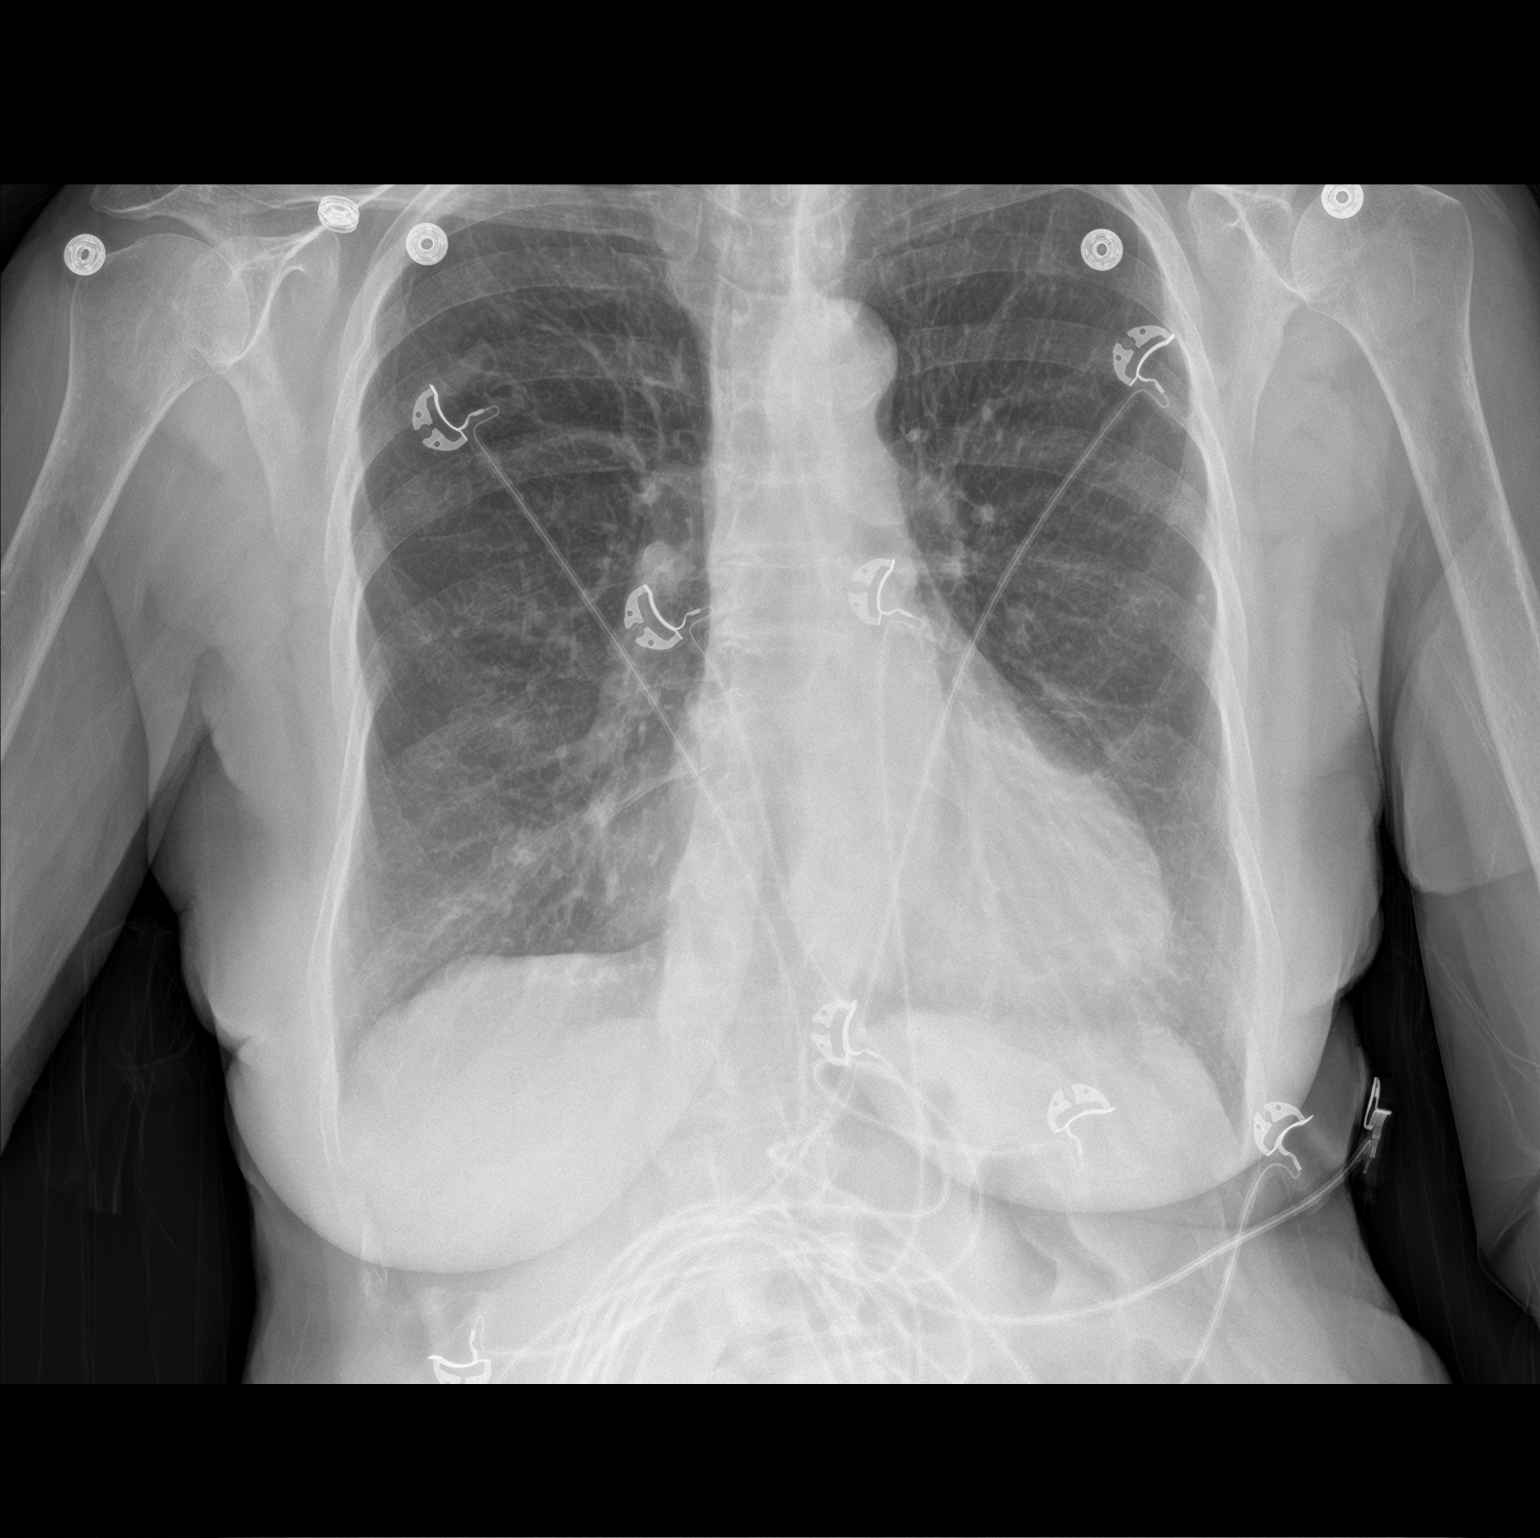

[chest lat]
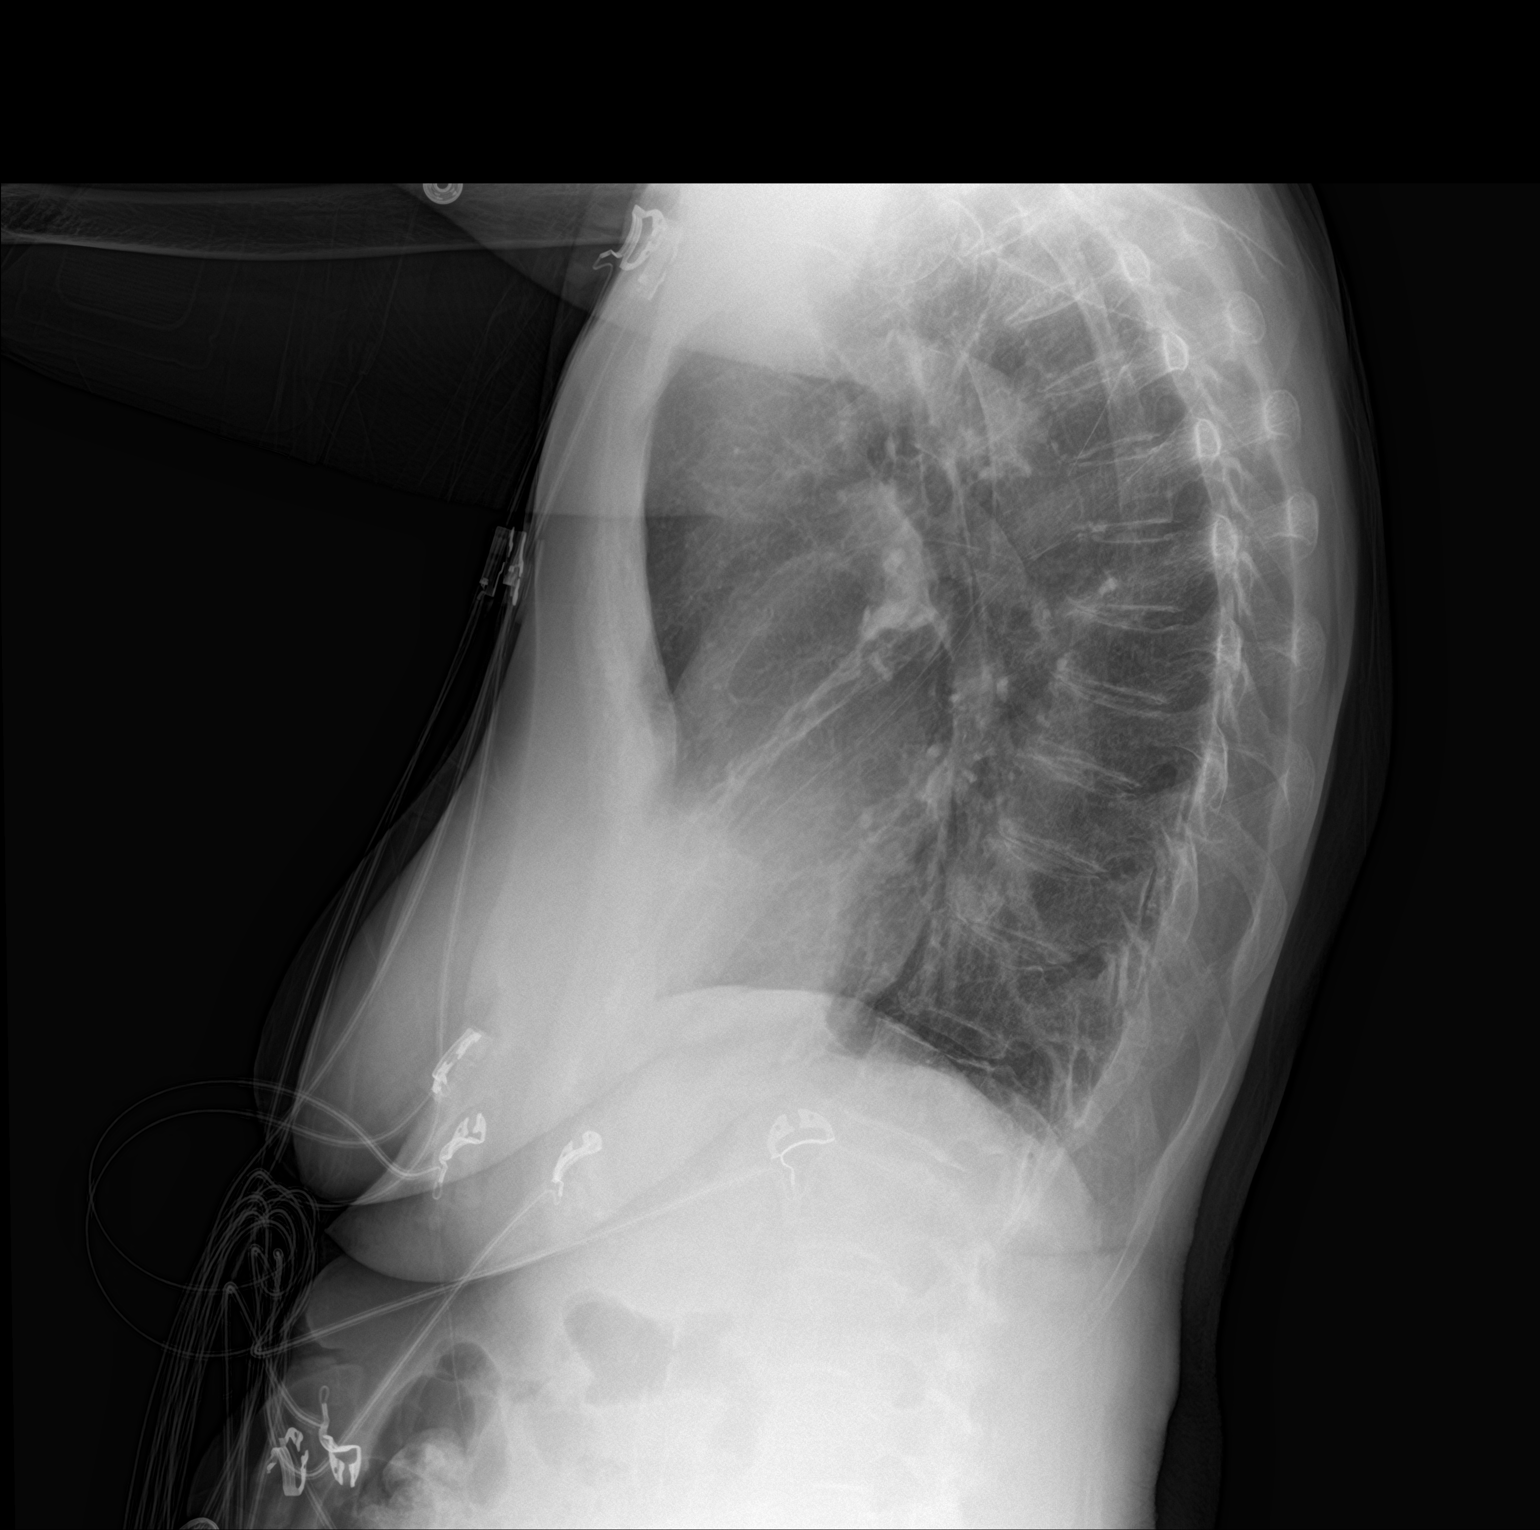

[2 of 2 positions shown; findings below may reference images not displayed]

FINDINGS: Cardiac shadow is within normal limits. Aortic calcifications are
again seen. The lungs are well aerated bilaterally. No focal
infiltrate or sizable effusion are seen. No acute bony abnormality
is noted.
IMPRESSION: No active cardiopulmonary disease.

## 2024-12-28 ENCOUNTER — Other Ambulatory Visit (HOSPITAL_COMMUNITY): Payer: Self-pay | Admitting: Family Medicine

## 2024-12-28 ENCOUNTER — Ambulatory Visit (HOSPITAL_COMMUNITY)
Admission: RE | Admit: 2024-12-28 | Discharge: 2024-12-28 | Disposition: A | Discharge status: Home / Self Care | Source: Ambulatory Visit | Attending: Family Medicine | Admitting: Family Medicine

## 2024-12-28 DIAGNOSIS — M940 Chondrocostal junction syndrome [Tietze]: Secondary | ICD-10-CM
